# Patient Record
Sex: Male | Born: 2000 | Race: Black or African American | Hispanic: Yes | Marital: Single
Health system: Southern US, Community
[De-identification: ages and names within clinical notes are randomized; demographics above are authoritative.]

---
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
## 2021-04-27 ENCOUNTER — Inpatient Hospital Stay (HOSPITAL_COMMUNITY)
Admission: EM | Admit: 2021-04-27 | Discharge: 2021-04-29 | DRG: 492 | Disposition: A | Discharge status: Home / Self Care | Payer: Medicaid Other | Attending: Student | Admitting: Student

## 2021-04-27 DIAGNOSIS — S52021B Displaced fracture of olecranon process without intraarticular extension of right ulna, initial encounter for open fracture type I or II: Secondary | ICD-10-CM | POA: Diagnosis present

## 2021-04-27 DIAGNOSIS — S52261A Displaced segmental fracture of shaft of ulna, right arm, initial encounter for closed fracture: Secondary | ICD-10-CM | POA: Diagnosis present

## 2021-04-27 DIAGNOSIS — S41141A Puncture wound with foreign body of right upper arm, initial encounter: Secondary | ICD-10-CM | POA: Diagnosis present

## 2021-04-27 DIAGNOSIS — S42401B Unspecified fracture of lower end of right humerus, initial encounter for open fracture: Secondary | ICD-10-CM

## 2021-04-27 DIAGNOSIS — T148XXA Other injury of unspecified body region, initial encounter: Secondary | ICD-10-CM

## 2021-04-27 DIAGNOSIS — W3400XA Accidental discharge from unspecified firearms or gun, initial encounter: Secondary | ICD-10-CM

## 2021-04-27 DIAGNOSIS — Z20822 Contact with and (suspected) exposure to covid-19: Secondary | ICD-10-CM | POA: Diagnosis present

## 2021-04-27 DIAGNOSIS — Z23 Encounter for immunization: Secondary | ICD-10-CM

## 2021-04-27 DIAGNOSIS — Z419 Encounter for procedure for purposes other than remedying health state, unspecified: Secondary | ICD-10-CM

## 2021-04-27 DIAGNOSIS — T1490XA Injury, unspecified, initial encounter: Secondary | ICD-10-CM

## 2021-04-27 DIAGNOSIS — S42441B Displaced fracture (avulsion) of medial epicondyle of right humerus, initial encounter for open fracture: Secondary | ICD-10-CM

## 2021-04-28 ENCOUNTER — Emergency Department (HOSPITAL_COMMUNITY): Payer: Medicaid Other

## 2021-04-28 ENCOUNTER — Other Ambulatory Visit: Payer: Self-pay

## 2021-04-28 ENCOUNTER — Inpatient Hospital Stay (HOSPITAL_COMMUNITY): Payer: Medicaid Other

## 2021-04-28 ENCOUNTER — Inpatient Hospital Stay (HOSPITAL_COMMUNITY): Payer: Medicaid Other | Admitting: Anesthesiology

## 2021-04-28 ENCOUNTER — Encounter (HOSPITAL_COMMUNITY)
Admission: EM | Disposition: A | Discharge status: Home / Self Care | Payer: Self-pay | Source: Home / Self Care | Attending: Student

## 2021-04-28 DIAGNOSIS — Y249XXA Unspecified firearm discharge, undetermined intent, initial encounter: Secondary | ICD-10-CM

## 2021-04-28 DIAGNOSIS — Z23 Encounter for immunization: Secondary | ICD-10-CM | POA: Diagnosis not present

## 2021-04-28 DIAGNOSIS — S52261A Displaced segmental fracture of shaft of ulna, right arm, initial encounter for closed fracture: Secondary | ICD-10-CM | POA: Diagnosis present

## 2021-04-28 DIAGNOSIS — Z20822 Contact with and (suspected) exposure to covid-19: Secondary | ICD-10-CM | POA: Diagnosis present

## 2021-04-28 DIAGNOSIS — W3400XA Accidental discharge from unspecified firearms or gun, initial encounter: Secondary | ICD-10-CM

## 2021-04-28 DIAGNOSIS — S41141A Puncture wound with foreign body of right upper arm, initial encounter: Secondary | ICD-10-CM | POA: Diagnosis present

## 2021-04-28 DIAGNOSIS — S42401B Unspecified fracture of lower end of right humerus, initial encounter for open fracture: Secondary | ICD-10-CM

## 2021-04-28 DIAGNOSIS — S42441B Displaced fracture (avulsion) of medial epicondyle of right humerus, initial encounter for open fracture: Secondary | ICD-10-CM

## 2021-04-28 DIAGNOSIS — S52021B Displaced fracture of olecranon process without intraarticular extension of right ulna, initial encounter for open fracture type I or II: Secondary | ICD-10-CM | POA: Diagnosis present

## 2021-04-28 HISTORY — PX: ORIF ELBOW FRACTURE: SHX5031

## 2021-04-28 LAB — I-STAT CHEM 8, ED
BUN: 15 mg/dL (ref 6–20)
Calcium, Ion: 1.01 mmol/L — ABNORMAL LOW (ref 1.15–1.40)
Chloride: 106 mmol/L (ref 98–111)
Creatinine, Ser: 0.9 mg/dL (ref 0.61–1.24)
Glucose, Bld: 144 mg/dL — ABNORMAL HIGH (ref 70–99)
HCT: 45 % (ref 39.0–52.0)
Hemoglobin: 15.3 g/dL (ref 13.0–17.0)
Potassium: 2.8 mmol/L — ABNORMAL LOW (ref 3.5–5.1)
Sodium: 141 mmol/L (ref 135–145)
TCO2: 22 mmol/L (ref 22–32)

## 2021-04-28 LAB — VITAMIN D 25 HYDROXY (VIT D DEFICIENCY, FRACTURES): Vit D, 25-Hydroxy: 17.45 ng/mL — ABNORMAL LOW (ref 30–100)

## 2021-04-28 LAB — CBC
HCT: 34.1 % — ABNORMAL LOW (ref 39.0–52.0)
HCT: 44.4 % (ref 39.0–52.0)
Hemoglobin: 11.3 g/dL — ABNORMAL LOW (ref 13.0–17.0)
Hemoglobin: 15 g/dL (ref 13.0–17.0)
MCH: 31.1 pg (ref 26.0–34.0)
MCH: 31.3 pg (ref 26.0–34.0)
MCHC: 33.1 g/dL (ref 30.0–36.0)
MCHC: 33.8 g/dL (ref 30.0–36.0)
MCV: 92.1 fL (ref 80.0–100.0)
MCV: 94.5 fL (ref 80.0–100.0)
Platelets: 195 10*3/uL (ref 150–400)
Platelets: 262 10*3/uL (ref 150–400)
RBC: 3.61 MIL/uL — ABNORMAL LOW (ref 4.22–5.81)
RBC: 4.82 MIL/uL (ref 4.22–5.81)
RDW: 11.5 % (ref 11.5–15.5)
RDW: 11.9 % (ref 11.5–15.5)
WBC: 8.6 10*3/uL (ref 4.0–10.5)
WBC: 8.8 10*3/uL (ref 4.0–10.5)
nRBC: 0 % (ref 0.0–0.2)
nRBC: 0 % (ref 0.0–0.2)

## 2021-04-28 LAB — PROTIME-INR
INR: 1.3 — ABNORMAL HIGH (ref 0.8–1.2)
Prothrombin Time: 16.5 seconds — ABNORMAL HIGH (ref 11.4–15.2)

## 2021-04-28 LAB — RESP PANEL BY RT-PCR (FLU A&B, COVID) ARPGX2
Influenza A by PCR: NEGATIVE
Influenza B by PCR: NEGATIVE
SARS Coronavirus 2 by RT PCR: NEGATIVE

## 2021-04-28 LAB — COMPREHENSIVE METABOLIC PANEL
ALT: 15 U/L (ref 0–44)
AST: 22 U/L (ref 15–41)
Albumin: 4.5 g/dL (ref 3.5–5.0)
Alkaline Phosphatase: 64 U/L (ref 38–126)
Anion gap: 15 (ref 5–15)
BUN: 15 mg/dL (ref 6–20)
CO2: 19 mmol/L — ABNORMAL LOW (ref 22–32)
Calcium: 9.6 mg/dL (ref 8.9–10.3)
Chloride: 103 mmol/L (ref 98–111)
Creatinine, Ser: 1 mg/dL (ref 0.61–1.24)
GFR, Estimated: >60 mL/min (ref >60)
Glucose, Bld: 146 mg/dL — ABNORMAL HIGH (ref 70–99)
Potassium: 2.9 mmol/L — ABNORMAL LOW (ref 3.5–5.1)
Sodium: 137 mmol/L (ref 135–145)
Total Bilirubin: 1.1 mg/dL (ref 0.3–1.2)
Total Protein: 7.2 g/dL (ref 6.5–8.1)

## 2021-04-28 LAB — CREATININE, SERUM
Creatinine, Ser: 0.83 mg/dL (ref 0.61–1.24)
GFR, Estimated: >60 mL/min (ref >60)

## 2021-04-28 LAB — URINALYSIS, ROUTINE W REFLEX MICROSCOPIC
Bilirubin Urine: NEGATIVE
Glucose, UA: NEGATIVE mg/dL
Hgb urine dipstick: NEGATIVE
Ketones, ur: NEGATIVE mg/dL
Leukocytes,Ua: NEGATIVE
Nitrite: NEGATIVE
Protein, ur: NEGATIVE mg/dL
Specific Gravity, Urine: >1.046 — ABNORMAL HIGH (ref 1.005–1.030)
pH: 6 (ref 5.0–8.0)

## 2021-04-28 LAB — SAMPLE TO BLOOD BANK

## 2021-04-28 LAB — HIV ANTIBODY (ROUTINE TESTING W REFLEX): HIV Screen 4th Generation wRfx: NONREACTIVE

## 2021-04-28 LAB — ETHANOL: Alcohol, Ethyl (B): <10 mg/dL (ref <10)

## 2021-04-28 LAB — LACTIC ACID, PLASMA: Lactic Acid, Venous: 1.9 mmol/L (ref 0.5–1.9)

## 2021-04-28 SURGERY — OPEN REDUCTION INTERNAL FIXATION (ORIF) ELBOW/OLECRANON FRACTURE
Anesthesia: General | Site: Elbow | Laterality: Right

## 2021-04-28 MED ORDER — SODIUM CHLORIDE 0.9 % IV SOLN: INTRAVENOUS | Status: DC

## 2021-04-28 MED ORDER — METHOCARBAMOL 1000 MG/10ML IJ SOLN
500.0000 mg | Freq: Four times a day (QID) | INTRAVENOUS | Status: DC | PRN
  Filled 2021-04-28: qty 5

## 2021-04-28 MED ORDER — CEFAZOLIN SODIUM-DEXTROSE 2-4 GM/100ML-% IV SOLN
2.0000 g | Freq: Three times a day (TID) | INTRAVENOUS | Status: DC
  Administered 2021-04-28: 2 g via INTRAVENOUS
  Filled 2021-04-28: qty 100

## 2021-04-28 MED ORDER — PROPOFOL 10 MG/ML IV BOLUS
INTRAVENOUS | Status: AC
  Filled 2021-04-28: qty 40

## 2021-04-28 MED ORDER — OXYCODONE HCL 5 MG PO TABS: 5.0000 mg | ORAL_TABLET | Freq: Once | ORAL | Status: DC | PRN

## 2021-04-28 MED ORDER — FENTANYL CITRATE (PF) 100 MCG/2ML IJ SOLN: 25.0000 ug | INTRAMUSCULAR | Status: DC | PRN

## 2021-04-28 MED ORDER — ONDANSETRON HCL 4 MG/2ML IJ SOLN: 4.0000 mg | Freq: Four times a day (QID) | INTRAMUSCULAR | Status: DC | PRN

## 2021-04-28 MED ORDER — LACTATED RINGERS IV SOLN: INTRAVENOUS | Status: DC | PRN

## 2021-04-28 MED ORDER — HYDROMORPHONE HCL 1 MG/ML IJ SOLN
1.0000 mg | INTRAMUSCULAR | Status: DC | PRN
  Administered 2021-04-28: 1 mg via INTRAVENOUS
  Filled 2021-04-28: qty 1

## 2021-04-28 MED ORDER — ENOXAPARIN SODIUM 40 MG/0.4ML IJ SOSY
40.0000 mg | PREFILLED_SYRINGE | INTRAMUSCULAR | Status: DC
  Administered 2021-04-29: 40 mg via SUBCUTANEOUS
  Filled 2021-04-28: qty 0.4

## 2021-04-28 MED ORDER — ACETAMINOPHEN 325 MG PO TABS
650.0000 mg | ORAL_TABLET | Freq: Four times a day (QID) | ORAL | Status: DC
  Administered 2021-04-28 – 2021-04-29 (×5): 650 mg via ORAL
  Filled 2021-04-28 (×4): qty 2

## 2021-04-28 MED ORDER — LIDOCAINE 2% (20 MG/ML) 5 ML SYRINGE
INTRAMUSCULAR | Status: DC | PRN
  Administered 2021-04-28: 80 mg via INTRAVENOUS

## 2021-04-28 MED ORDER — ONDANSETRON HCL 4 MG PO TABS: 4.0000 mg | ORAL_TABLET | Freq: Four times a day (QID) | ORAL | Status: DC | PRN

## 2021-04-28 MED ORDER — IOHEXOL 350 MG/ML SOLN
100.0000 mL | Freq: Once | INTRAVENOUS | Status: AC | PRN
  Administered 2021-04-28: 100 mL via INTRAVENOUS

## 2021-04-28 MED ORDER — 0.9 % SODIUM CHLORIDE (POUR BTL) OPTIME
TOPICAL | Status: DC | PRN
  Administered 2021-04-28: 1000 mL

## 2021-04-28 MED ORDER — CEFAZOLIN SODIUM-DEXTROSE 2-4 GM/100ML-% IV SOLN
2.0000 g | Freq: Once | INTRAVENOUS | Status: AC
  Administered 2021-04-28: 2 g via INTRAVENOUS
  Filled 2021-04-28: qty 100

## 2021-04-28 MED ORDER — GABAPENTIN 100 MG PO CAPS: 100.0000 mg | ORAL_CAPSULE | Freq: Three times a day (TID) | ORAL | 0 refills | Status: AC

## 2021-04-28 MED ORDER — DIPHENHYDRAMINE HCL 12.5 MG/5ML PO ELIX: 12.5000 mg | ORAL_SOLUTION | ORAL | Status: DC | PRN

## 2021-04-28 MED ORDER — ASPIRIN EC 81 MG PO TBEC: 81.0000 mg | DELAYED_RELEASE_TABLET | Freq: Every day | ORAL | 0 refills | Status: AC

## 2021-04-28 MED ORDER — PROPOFOL 10 MG/ML IV BOLUS
INTRAVENOUS | Status: DC | PRN
  Administered 2021-04-28: 200 mg via INTRAVENOUS

## 2021-04-28 MED ORDER — CHLORHEXIDINE GLUCONATE 0.12 % MT SOLN
15.0000 mL | Freq: Once | OROMUCOSAL | Status: AC
  Administered 2021-04-28: 15 mL via OROMUCOSAL

## 2021-04-28 MED ORDER — LIDOCAINE 2% (20 MG/ML) 5 ML SYRINGE
INTRAMUSCULAR | Status: AC
  Filled 2021-04-28: qty 5

## 2021-04-28 MED ORDER — ACETAMINOPHEN 325 MG PO TABS: 325.0000 mg | ORAL_TABLET | Freq: Four times a day (QID) | ORAL | Status: DC | PRN

## 2021-04-28 MED ORDER — ONDANSETRON HCL 4 MG/2ML IJ SOLN
4.0000 mg | Freq: Four times a day (QID) | INTRAMUSCULAR | Status: DC | PRN
  Administered 2021-04-28: 4 mg via INTRAVENOUS
  Filled 2021-04-28: qty 2

## 2021-04-28 MED ORDER — METHOCARBAMOL 500 MG PO TABS: 500.0000 mg | ORAL_TABLET | Freq: Four times a day (QID) | ORAL | 0 refills | Status: AC | PRN

## 2021-04-28 MED ORDER — SUCCINYLCHOLINE CHLORIDE 20 MG/ML IJ SOLN
INTRAMUSCULAR | Status: DC | PRN
  Administered 2021-04-28: 60 mg via INTRAVENOUS

## 2021-04-28 MED ORDER — CEFAZOLIN SODIUM-DEXTROSE 2-4 GM/100ML-% IV SOLN
2.0000 g | Freq: Three times a day (TID) | INTRAVENOUS | Status: AC
  Administered 2021-04-28 – 2021-04-29 (×3): 2 g via INTRAVENOUS
  Filled 2021-04-28 (×4): qty 100

## 2021-04-28 MED ORDER — OXYCODONE HCL 5 MG/5ML PO SOLN: 5.0000 mg | Freq: Once | ORAL | Status: DC | PRN

## 2021-04-28 MED ORDER — METHOCARBAMOL 500 MG PO TABS: 500.0000 mg | ORAL_TABLET | Freq: Four times a day (QID) | ORAL | Status: DC | PRN

## 2021-04-28 MED ORDER — ORAL CARE MOUTH RINSE: 15.0000 mL | Freq: Once | OROMUCOSAL | Status: AC

## 2021-04-28 MED ORDER — MIDAZOLAM HCL 5 MG/5ML IJ SOLN
INTRAMUSCULAR | Status: DC | PRN
  Administered 2021-04-28: 2 mg via INTRAVENOUS

## 2021-04-28 MED ORDER — DEXAMETHASONE SODIUM PHOSPHATE 10 MG/ML IJ SOLN
INTRAMUSCULAR | Status: AC
  Filled 2021-04-28: qty 1

## 2021-04-28 MED ORDER — POLYETHYLENE GLYCOL 3350 17 G PO PACK: 17.0000 g | PACK | Freq: Every day | ORAL | Status: DC | PRN

## 2021-04-28 MED ORDER — TOBRAMYCIN SULFATE 1.2 G IJ SOLR
INTRAMUSCULAR | Status: DC | PRN
  Administered 2021-04-28: 1.2 g via TOPICAL

## 2021-04-28 MED ORDER — METOCLOPRAMIDE HCL 5 MG/ML IJ SOLN: 5.0000 mg | Freq: Three times a day (TID) | INTRAMUSCULAR | Status: DC | PRN

## 2021-04-28 MED ORDER — METOCLOPRAMIDE HCL 5 MG PO TABS: 5.0000 mg | ORAL_TABLET | Freq: Three times a day (TID) | ORAL | Status: DC | PRN

## 2021-04-28 MED ORDER — FENTANYL CITRATE (PF) 100 MCG/2ML IJ SOLN
INTRAMUSCULAR | Status: AC | PRN
  Administered 2021-04-28: 50 ug via INTRAVENOUS

## 2021-04-28 MED ORDER — DOCUSATE SODIUM 100 MG PO CAPS: 100.0000 mg | ORAL_CAPSULE | Freq: Two times a day (BID) | ORAL | 0 refills | Status: AC

## 2021-04-28 MED ORDER — FENTANYL CITRATE (PF) 250 MCG/5ML IJ SOLN
INTRAMUSCULAR | Status: AC
  Filled 2021-04-28: qty 5

## 2021-04-28 MED ORDER — LACTATED RINGERS IV SOLN: INTRAVENOUS | Status: DC

## 2021-04-28 MED ORDER — ONDANSETRON HCL 4 MG/2ML IJ SOLN
INTRAMUSCULAR | Status: DC | PRN
  Administered 2021-04-28: 4 mg via INTRAVENOUS

## 2021-04-28 MED ORDER — ONDANSETRON HCL 4 MG/2ML IJ SOLN
INTRAMUSCULAR | Status: AC
  Filled 2021-04-28: qty 2

## 2021-04-28 MED ORDER — AMISULPRIDE (ANTIEMETIC) 5 MG/2ML IV SOLN: 10.0000 mg | Freq: Once | INTRAVENOUS | Status: DC | PRN

## 2021-04-28 MED ORDER — ROCURONIUM BROMIDE 10 MG/ML (PF) SYRINGE
PREFILLED_SYRINGE | INTRAVENOUS | Status: DC | PRN
  Administered 2021-04-28: 25 mg via INTRAVENOUS
  Administered 2021-04-28: 5 mg via INTRAVENOUS

## 2021-04-28 MED ORDER — FENTANYL CITRATE (PF) 100 MCG/2ML IJ SOLN
INTRAMUSCULAR | Status: AC
  Filled 2021-04-28: qty 2

## 2021-04-28 MED ORDER — DOCUSATE SODIUM 100 MG PO CAPS
100.0000 mg | ORAL_CAPSULE | Freq: Two times a day (BID) | ORAL | Status: DC
  Administered 2021-04-28 – 2021-04-29 (×3): 100 mg via ORAL
  Filled 2021-04-28 (×3): qty 1

## 2021-04-28 MED ORDER — ONDANSETRON HCL 4 MG/2ML IJ SOLN: 4.0000 mg | Freq: Once | INTRAMUSCULAR | Status: DC | PRN

## 2021-04-28 MED ORDER — PHENYLEPHRINE 40 MCG/ML (10ML) SYRINGE FOR IV PUSH (FOR BLOOD PRESSURE SUPPORT)
PREFILLED_SYRINGE | INTRAVENOUS | Status: AC
  Filled 2021-04-28: qty 10

## 2021-04-28 MED ORDER — MIDAZOLAM HCL 2 MG/2ML IJ SOLN
INTRAMUSCULAR | Status: AC
  Filled 2021-04-28: qty 2

## 2021-04-28 MED ORDER — OXYCODONE HCL 10 MG PO TABS: 5.0000 mg | ORAL_TABLET | ORAL | 0 refills | Status: AC | PRN

## 2021-04-28 MED ORDER — SUCCINYLCHOLINE CHLORIDE 200 MG/10ML IV SOSY
PREFILLED_SYRINGE | INTRAVENOUS | Status: AC
  Filled 2021-04-28: qty 10

## 2021-04-28 MED ORDER — PHENYLEPHRINE HCL (PRESSORS) 10 MG/ML IV SOLN
INTRAVENOUS | Status: DC | PRN
  Administered 2021-04-28 (×7): 100 ug via INTRAVENOUS

## 2021-04-28 MED ORDER — FENTANYL CITRATE (PF) 250 MCG/5ML IJ SOLN
INTRAMUSCULAR | Status: DC | PRN
  Administered 2021-04-28: 50 ug via INTRAVENOUS

## 2021-04-28 MED ORDER — VANCOMYCIN HCL 1000 MG IV SOLR
INTRAVENOUS | Status: DC | PRN
  Administered 2021-04-28: 1000 mg via TOPICAL

## 2021-04-28 MED ORDER — ROCURONIUM BROMIDE 10 MG/ML (PF) SYRINGE
PREFILLED_SYRINGE | INTRAVENOUS | Status: AC
  Filled 2021-04-28: qty 10

## 2021-04-28 MED ORDER — METHOCARBAMOL 1000 MG/10ML IJ SOLN
500.0000 mg | Freq: Four times a day (QID) | INTRAVENOUS | Status: DC | PRN
  Filled 2021-04-28 (×2): qty 5

## 2021-04-28 MED ORDER — KETOROLAC TROMETHAMINE 15 MG/ML IJ SOLN
15.0000 mg | Freq: Four times a day (QID) | INTRAMUSCULAR | Status: AC
  Administered 2021-04-28 (×4): 15 mg via INTRAVENOUS
  Filled 2021-04-28 (×4): qty 1

## 2021-04-28 MED ORDER — OXYCODONE HCL 5 MG PO TABS
5.0000 mg | ORAL_TABLET | ORAL | Status: DC | PRN
  Administered 2021-04-28 – 2021-04-29 (×3): 10 mg via ORAL
  Filled 2021-04-28 (×3): qty 2

## 2021-04-28 MED ORDER — VANCOMYCIN HCL 1000 MG IV SOLR
INTRAVENOUS | Status: AC
  Filled 2021-04-28: qty 1000

## 2021-04-28 MED ORDER — DEXAMETHASONE SODIUM PHOSPHATE 10 MG/ML IJ SOLN
INTRAMUSCULAR | Status: DC | PRN
  Administered 2021-04-28: 4 mg via INTRAVENOUS

## 2021-04-28 MED ORDER — METHOCARBAMOL 500 MG PO TABS
500.0000 mg | ORAL_TABLET | Freq: Four times a day (QID) | ORAL | Status: DC | PRN
  Administered 2021-04-28: 500 mg via ORAL
  Filled 2021-04-28: qty 1

## 2021-04-28 MED ORDER — HYDRALAZINE HCL 10 MG PO TABS: 10.0000 mg | ORAL_TABLET | Freq: Four times a day (QID) | ORAL | Status: DC | PRN

## 2021-04-28 MED ORDER — OXYCODONE HCL 5 MG PO TABS
10.0000 mg | ORAL_TABLET | ORAL | Status: DC | PRN
  Administered 2021-04-29: 15 mg via ORAL
  Filled 2021-04-28: qty 3

## 2021-04-28 MED ORDER — TOBRAMYCIN SULFATE 1.2 G IJ SOLR
INTRAMUSCULAR | Status: AC
  Filled 2021-04-28: qty 1.2

## 2021-04-28 MED ORDER — TETANUS-DIPHTH-ACELL PERTUSSIS 5-2.5-18.5 LF-MCG/0.5 IM SUSY
0.5000 mL | PREFILLED_SYRINGE | Freq: Once | INTRAMUSCULAR | Status: AC
  Administered 2021-04-28: 0.5 mL via INTRAMUSCULAR
  Filled 2021-04-28: qty 0.5

## 2021-04-28 SURGICAL SUPPLY — 99 items
BAG COUNTER SPONGE SURGICOUNT (BAG) ×2 IMPLANT
BENZOIN TINCTURE PRP APPL 2/3 (GAUZE/BANDAGES/DRESSINGS) IMPLANT
BIT DRILL 2.7 QC CANN 155 (BIT) ×2 IMPLANT
BIT DRILL QC 2.0 SHORT EVOS SM (DRILL) ×1 IMPLANT
BIT DRILL QC 2.5MM SHRT EVO SM (DRILL) ×1 IMPLANT
BLADE AVERAGE 25X9 (BLADE) IMPLANT
BLADE CLIPPER SURG (BLADE) ×2 IMPLANT
BLADE SURG 10 STRL SS (BLADE) IMPLANT
BNDG COHESIVE 4X5 TAN STRL (GAUZE/BANDAGES/DRESSINGS) ×2 IMPLANT
BNDG ELASTIC 3X5.8 VLCR STR LF (GAUZE/BANDAGES/DRESSINGS) ×2 IMPLANT
BNDG ELASTIC 4X5.8 VLCR STR LF (GAUZE/BANDAGES/DRESSINGS) ×2 IMPLANT
BNDG ELASTIC 6X5.8 VLCR STR LF (GAUZE/BANDAGES/DRESSINGS) ×2 IMPLANT
BNDG ESMARK 4X9 LF (GAUZE/BANDAGES/DRESSINGS) IMPLANT
BNDG GAUZE ELAST 4 BULKY (GAUZE/BANDAGES/DRESSINGS) ×2 IMPLANT
BRUSH SCRUB EZ PLAIN DRY (MISCELLANEOUS) ×4 IMPLANT
CHLORAPREP W/TINT 26 (MISCELLANEOUS) ×2 IMPLANT
CLEANER TIP ELECTROSURG 2X2 (MISCELLANEOUS) ×2 IMPLANT
COVER SURGICAL LIGHT HANDLE (MISCELLANEOUS) ×4 IMPLANT
CUFF TOURN SGL QUICK 18X4 (TOURNIQUET CUFF) IMPLANT
CUFF TOURN SGL QUICK 24 (TOURNIQUET CUFF)
CUFF TRNQT CYL 24X4X16.5-23 (TOURNIQUET CUFF) IMPLANT
DECANTER SPIKE VIAL GLASS SM (MISCELLANEOUS) IMPLANT
DRAPE C-ARM 42X72 X-RAY (DRAPES) ×2 IMPLANT
DRAPE C-ARMOR (DRAPES) ×2 IMPLANT
DRAPE INCISE IOBAN 66X45 STRL (DRAPES) IMPLANT
DRAPE ORTHO SPLIT 77X108 STRL (DRAPES) ×2
DRAPE SURG ORHT 6 SPLT 77X108 (DRAPES) ×2 IMPLANT
DRAPE U-SHAPE 47X51 STRL (DRAPES) ×2 IMPLANT
DRILL QC 2.0 SHORT EVOS SM (DRILL) ×2
DRILL QC 2.5MM SHORT EVOS SM (DRILL) ×2
DRSG ADAPTIC 3X8 NADH LF (GAUZE/BANDAGES/DRESSINGS) IMPLANT
DRSG MEPITEL 4X7.2 (GAUZE/BANDAGES/DRESSINGS) ×2 IMPLANT
ELECT REM PT RETURN 9FT ADLT (ELECTROSURGICAL) ×2
ELECTRODE REM PT RTRN 9FT ADLT (ELECTROSURGICAL) ×1 IMPLANT
GAUZE SPONGE 4X4 12PLY STRL (GAUZE/BANDAGES/DRESSINGS) ×2 IMPLANT
GAUZE SPONGE 4X4 12PLY STRL LF (GAUZE/BANDAGES/DRESSINGS) ×2 IMPLANT
GAUZE XEROFORM 5X9 LF (GAUZE/BANDAGES/DRESSINGS) ×2 IMPLANT
GLOVE SURG ENC MOIS LTX SZ6.5 (GLOVE) ×6 IMPLANT
GLOVE SURG ENC MOIS LTX SZ7.5 (GLOVE) ×8 IMPLANT
GLOVE SURG UNDER POLY LF SZ6.5 (GLOVE) ×2 IMPLANT
GLOVE SURG UNDER POLY LF SZ7.5 (GLOVE) ×2 IMPLANT
GOWN STRL REUS W/ TWL LRG LVL3 (GOWN DISPOSABLE) ×2 IMPLANT
GOWN STRL REUS W/TWL LRG LVL3 (GOWN DISPOSABLE) ×2
K-WIRE 1.3X40 (WIRE) ×4
K-WIRE 1.6 (WIRE) ×1
K-WIRE FX150X1.6XTROC PNT (WIRE) ×1
KIT BASIN OR (CUSTOM PROCEDURE TRAY) ×2 IMPLANT
KIT TURNOVER KIT B (KITS) ×2 IMPLANT
KWIRE 1.3X40 (WIRE) ×2 IMPLANT
KWIRE FX150X1.6XTROC PNT (WIRE) ×1 IMPLANT
MANIFOLD NEPTUNE II (INSTRUMENTS) ×2 IMPLANT
NS IRRIG 1000ML POUR BTL (IV SOLUTION) ×2 IMPLANT
PACK ORTHO EXTREMITY (CUSTOM PROCEDURE TRAY) ×2 IMPLANT
PAD ABD 8X10 STRL (GAUZE/BANDAGES/DRESSINGS) ×2 IMPLANT
PAD ARMBOARD 7.5X6 YLW CONV (MISCELLANEOUS) ×4 IMPLANT
PAD CAST 3X4 CTTN HI CHSV (CAST SUPPLIES) ×1 IMPLANT
PAD CAST 4YDX4 CTTN HI CHSV (CAST SUPPLIES) ×1 IMPLANT
PADDING CAST COTTON 3X4 STRL (CAST SUPPLIES) ×1
PADDING CAST COTTON 4X4 STRL (CAST SUPPLIES) ×1
PLATE OLEC EVOS 2.7X179 13H RT (Plate) ×2 IMPLANT
SCREW BONE LOCKING 2.7 X 48 (Screw) ×2 IMPLANT
SCREW CANN FT ST 4X60 (Screw) ×2 IMPLANT
SCREW CORT 2.7X14 T8 EVOS (Screw) ×2 IMPLANT
SCREW CORT 2.7X20 T8 ST EVOS (Screw) ×2 IMPLANT
SCREW CORT 2.7X22 T8 ST EVOS (Screw) ×2 IMPLANT
SCREW CORT 3.5X13MM (Screw) ×4 IMPLANT
SCREW CORT 3.5X14 ST EVOS (Screw) ×2 IMPLANT
SCREW CORT 3.5X16 ST EVOS (Screw) ×2 IMPLANT
SCREW CORT 3.5X20 ST EVOS (Screw) ×2 IMPLANT
SCREW CORT 3.5X22 ST EVOS (Screw) ×2 IMPLANT
SCREW CORT EVOS ST 3.5X12 (Screw) ×2 IMPLANT
SCREW CORT VA EVOS 2.7X28 (Screw) ×2 IMPLANT
SCREW EVOS 2.7 X 50 LCK T8 S-T (Screw) ×2 IMPLANT
SCREW FULL THREAD 4.0X30 (Screw) ×2 IMPLANT
SPLINT PLASTER CAST XFAST 5X30 (CAST SUPPLIES) ×1 IMPLANT
SPLINT PLASTER XFAST SET 5X30 (CAST SUPPLIES) ×1
SPONGE T-LAP 18X18 ~~LOC~~+RFID (SPONGE) ×4 IMPLANT
STAPLER VISISTAT 35W (STAPLE) ×2 IMPLANT
STRIP CLOSURE SKIN 1/2X4 (GAUZE/BANDAGES/DRESSINGS) IMPLANT
SUCTION FRAZIER HANDLE 10FR (MISCELLANEOUS) ×1
SUCTION TUBE FRAZIER 10FR DISP (MISCELLANEOUS) ×1 IMPLANT
SUT ETHILON 3 0 PS 1 (SUTURE) ×4 IMPLANT
SUT MNCRL AB 3-0 PS2 18 (SUTURE) ×2 IMPLANT
SUT MON AB 2-0 CT1 36 (SUTURE) ×4 IMPLANT
SUT PDS AB 2-0 CT1 27 (SUTURE) IMPLANT
SUT PROLENE 0 CT (SUTURE) IMPLANT
SUT PROLENE 3 0 PS 2 (SUTURE) ×4 IMPLANT
SUT VIC AB 0 CT1 27 (SUTURE) ×2
SUT VIC AB 0 CT1 27XBRD ANBCTR (SUTURE) ×2 IMPLANT
SUT VIC AB 2-0 CT1 27 (SUTURE) ×2
SUT VIC AB 2-0 CT1 TAPERPNT 27 (SUTURE) ×2 IMPLANT
SUT VIC AB 2-0 CT3 27 (SUTURE) IMPLANT
SYR CONTROL 10ML LL (SYRINGE) ×2 IMPLANT
TOWEL GREEN STERILE (TOWEL DISPOSABLE) ×4 IMPLANT
TOWEL GREEN STERILE FF (TOWEL DISPOSABLE) IMPLANT
TUBE CONNECTING 12X1/4 (SUCTIONS) ×2 IMPLANT
UNDERPAD 30X36 HEAVY ABSORB (UNDERPADS AND DIAPERS) ×2 IMPLANT
WATER STERILE IRR 1000ML POUR (IV SOLUTION) ×2 IMPLANT
YANKAUER SUCT BULB TIP NO VENT (SUCTIONS) ×2 IMPLANT

## 2021-04-28 NOTE — Plan of Care (Signed)

## 2021-04-28 NOTE — ED Notes (Signed)
Ortho at bedside applying splint

## 2021-04-28 NOTE — ED Notes (Signed)
Pt in room on phone with mother. A&O x4. using urinal at bedside

## 2021-04-28 NOTE — ED Notes (Signed)
Pt in CT scan.

## 2021-04-28 NOTE — ED Notes (Addendum)
Torniquet removed at this time. Pulse present. Pressure dressing applied.
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
## 2021-04-28 NOTE — ED Notes (Signed)
Pt resting in room. NAD Mother at bedside. Updated on plan of care.

## 2021-04-28 NOTE — Anesthesia Postprocedure Evaluation (Signed)
Anesthesia Post Note  Patient: Joshua Taylor  Procedure(s) Performed: OPEN REDUCTION INTERNAL FIXATION (ORIF) ELBOW/OLECRANON FRACTURE (Right: Elbow)     Patient location during evaluation: PACU Anesthesia Type: General Level of consciousness: awake and alert Pain management: pain level controlled Vital Signs Assessment: post-procedure vital signs reviewed and stable Respiratory status: spontaneous breathing, nonlabored ventilation and respiratory function stable Cardiovascular status: blood pressure returned to baseline and stable Postop Assessment: no apparent nausea or vomiting Anesthetic complications: no   No notable events documented.  Last Vitals:  Vitals:   04/28/21 1127 04/28/21 1145  BP: 127/80 127/80  Pulse: 81 75  Resp: 12 11  Temp:  (!) 36.4 C  SpO2: 100% 100%    Last Pain:  Vitals:   04/28/21 1145  TempSrc:   PainSc: Asleep                 Lucretia Kern
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
## 2021-04-28 NOTE — Anesthesia Procedure Notes (Signed)
Procedure Name: Intubation Date/Time: 04/28/2021 8:10 AM Performed by: Geraldine Contras, CRNA Pre-anesthesia Checklist: Patient identified, Patient being monitored, Timeout performed, Emergency Drugs available and Suction available Patient Re-evaluated:Patient Re-evaluated prior to induction Oxygen Delivery Method: Circle system utilized Preoxygenation: Pre-oxygenation with 100% oxygen Induction Type: IV induction Ventilation: Mask ventilation without difficulty Laryngoscope Size: Mac and 3 Grade View: Grade I Tube type: Oral Tube size: 7.0 mm Number of attempts: 1 Airway Equipment and Method: Stylet Placement Confirmation: ETT inserted through vocal cords under direct vision, positive ETCO2 and breath sounds checked- equal and bilateral Secured at: 21 cm Tube secured with: Tape Dental Injury: Teeth and Oropharynx as per pre-operative assessment
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
## 2021-04-28 NOTE — Progress Notes (Signed)
Orthopedic Tech Progress Note Patient Details:  Joshua Taylor 05/17/2001 [id number]  Ortho Devices Type of Ortho Device: Long arm splint Ortho Device/Splint Location: RUE Ortho Device/Splint Interventions: Ordered, Application, Adjustment   Post Interventions Patient Tolerated: Fair Instructions Provided: Other (comment)  [name] 04/28/2021, 1:37 AM
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
## 2021-04-28 NOTE — Transfer of Care (Signed)
Immediate Anesthesia Transfer of Care Note  Patient: Joshua Taylor  Procedure(s) Performed: OPEN REDUCTION INTERNAL FIXATION (ORIF) ELBOW/OLECRANON FRACTURE (Right: Elbow)  Patient Location: PACU  Anesthesia Type:General  Level of Consciousness: awake  Airway & Oxygen Therapy: Patient Spontanous Breathing  Post-op Assessment: Report given to RN  Post vital signs: stable  Last Vitals:  Vitals Value Taken Time  BP    Temp    Pulse    Resp    SpO2      Last Pain:  Vitals:   04/28/21 0733  TempSrc: Oral  PainSc:          Complications: No notable events documented.
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
## 2021-04-28 NOTE — ED Notes (Addendum)
Pt given apple juice.  Pt tolerated well.

## 2021-04-28 NOTE — ED Notes (Signed)
Patient transported to CT 

## 2021-04-28 NOTE — Discharge Instructions (Signed)
Truitt Merle, MD Ulyses Southward PA-C [facility] [address] [phone] Jani Files)   [phone] (fax)                                  POST-OPERATIVE INSTRUCTIONS   WEIGHT BEARING STATUS: Non-weightbearing right arm  RANGE OF MOTION/ACTIVITY: Wear sling for comfort. Do no remove splint  WOUND CARE Please keep splint clean dry and intact until follow-up. If your splint gets wet for any reason please contact the office immediately.  Do not stick anything down your splint such as pencils, momey, hangers to try and scratch yourself.  If you feel itchy take Benadryl as prescribed on the bottle for itching You may shower on Post-Op Day #2.  You must keep splint dry during this process and may find that a plastic bag taped around the extremity or alternatively a towel based bath may be a better option.   If you get your splint wet or if it is damaged please contact our clinic.  EXERCISES Due to your splint being in place you will not be able to bear weight through your extremity.   Please use your sling until follow-up. Please continue to work on range of motion of your fingers and stretch these multiple times a day to prevent stiffness. Please continue to ambulate and do not stay sitting or lying for too long. Perform foot and wrist pumps to assist in circulation.  DVT/PE prophylaxis: Aspirin 325 mg daily x 30 days  DIET: As you were eating previously.  Can use over the counter stool softeners and bowel preparations, such as Miralax, to help with bowel movements.  Narcotics can be constipating.  Be sure to drink plenty of fluids  REGIONAL ANESTHESIA (NERVE BLOCKS) The anesthesia team may have performed a nerve block for you if safe in the setting of your care.  This is a great tool used to minimize pain.  Typically the block may start wearing off overnight but the long acting medicine may last for 3-4 days.  The nerve block wearing off can be a challenging period  but please utilize your as needed pain medications to try and manage this period.    ICE AND ELEVATE INJURED/OPERATIVE EXTREMITY Using ice and elevating the injured extremity above your heart can help with swelling and pain control.  Icing in a pulsatile fashion, such as 20 minutes on and 20 minutes off, can be followed.   Do not place ice directly on skin. Make sure there is a barrier between to skin and the ice pack.   Using frozen items such as frozen peas works well as the conform nicely to the are that needs to be iced.  POST-OP MEDICATIONS- Multimodal approach to pain control In general your pain will be controlled with a combination of substances.  Prescriptions unless otherwise discussed are electronically sent to your pharmacy.  This is a carefully made plan we use to minimize narcotic use.                     -   Acetaminophen - Non-narcotic pain medicine taken on a scheduled basis        -   Oxycodone - This is a strong narcotic, to be used only on an "as needed" basis for pain.                  -  Robaxin -  Muscle relaxer taken on an "as needed" basis for muscle spasms                  -   Gabapentin - Helps with nerve pain, such as burning or tingling feeling in the operative arm             STOP SMOKING OR USING NICOTINE PRODUCTS!!!!  As discussed nicotine severely impairs your body's ability to heal surgical and traumatic wounds but also impairs bone healing.  Wounds and bone heal by forming microscopic blood vessels (angiogenesis) and nicotine is a vasoconstrictor (essentially, shrinks blood vessels).  Therefore, if vasoconstriction occurs to these microscopic blood vessels they essentially disappear and are unable to deliver necessary nutrients to the healing tissue.  This is one modifiable factor that you can do to dramatically increase your chances of healing your injury.    (This means no smoking, no nicotine gum, patches, etc)   FOLLOW-UP If you develop a Fever (>101.5),  Redness or Drainage from the surgical incision site, please call our office to arrange for an evaluation. Please call the office to schedule a follow-up appointment for your incision check if you do not already have one, 7-10 days post-operatively.  CALL THE OFFICE WITH ANY QUESTIONS OR CONCERNS: [phone]   VISIT OUR WEBSITE FOR ADDITIONAL INFORMATION: [email or web address]    OTHER HELPFUL INFORMATION  If you had a block, it will wear off between 8-24 hrs postop typically.  This is period when your pain may go from nearly zero to the pain you would have had postop without the block.  This is an abrupt transition but nothing dangerous is happening.  You may take an extra dose of narcotic when this happens.  You may be more comfortable sleeping in a semi-seated position the first few nights following surgery.  Keep a pillow propped under the elbow and forearm for comfort.  If you have a recliner type of chair it might be beneficial.  If not that is fine too, but it would be helpful to sleep propped up with pillows behind your operated shoulder as well under your elbow and forearm.  This will reduce pulling on the suture lines.  When dressing, put your operative arm in the sleeve first.  When getting undressed, take your operative arm out last.  Loose fitting, button-down shirts are recommended.  Often in the first days after surgery you may be more comfortable keeping your operative arm under your shirt and not through the sleeve.  You may return to work/school in the next couple of days when you feel up to it.  Desk work and typing in the sling is fine.  We suggest you use the pain medication the first night prior to going to bed, in order to ease any pain when the anesthesia wears off. You should avoid taking pain medications on an empty stomach as it will make you nauseous. You should wean off your narcotic medicines as soon as you are able.  Most patients will be off or using minimal  narcotics before their first postop appointment.   Do not drink alcoholic beverages or take illicit drugs when taking pain medications.  It is against the law to drive while taking narcotics.  In some states it is against the law to drive while your arm is in a sling.   Pain medication may make you constipated.  Below are a few solutions to try in this order: Decrease the amount of pain  medication if you aren't having pain. Drink lots of decaffeinated fluids. Drink prune juice and/or each dried prunes  If the first 3 don't work start with additional solutions -   Take Colace - an over-the-counter stool softener -   Take Senokot - an over-the-counter laxative -   Take Miralax - a stronger over-the-counter laxative
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
## 2021-04-29 LAB — BASIC METABOLIC PANEL
Anion gap: 5 (ref 5–15)
BUN: 9 mg/dL (ref 6–20)
CO2: 24 mmol/L (ref 22–32)
Calcium: 8.4 mg/dL — ABNORMAL LOW (ref 8.9–10.3)
Chloride: 108 mmol/L (ref 98–111)
Creatinine, Ser: 0.73 mg/dL (ref 0.61–1.24)
GFR, Estimated: >60 mL/min (ref >60)
Glucose, Bld: 123 mg/dL — ABNORMAL HIGH (ref 70–99)
Potassium: 3.9 mmol/L (ref 3.5–5.1)
Sodium: 137 mmol/L (ref 135–145)

## 2021-04-29 LAB — CBC
HCT: 28.1 % — ABNORMAL LOW (ref 39.0–52.0)
Hemoglobin: 9.7 g/dL — ABNORMAL LOW (ref 13.0–17.0)
MCH: 31.8 pg (ref 26.0–34.0)
MCHC: 34.5 g/dL (ref 30.0–36.0)
MCV: 92.1 fL (ref 80.0–100.0)
Platelets: 181 10*3/uL (ref 150–400)
RBC: 3.05 MIL/uL — ABNORMAL LOW (ref 4.22–5.81)
RDW: 11.9 % (ref 11.5–15.5)
WBC: 10.8 10*3/uL — ABNORMAL HIGH (ref 4.0–10.5)
nRBC: 0 % (ref 0.0–0.2)

## 2021-04-29 MED ORDER — ENOXAPARIN SODIUM 30 MG/0.3ML IJ SOSY: 30.0000 mg | PREFILLED_SYRINGE | INTRAMUSCULAR | Status: DC

## 2021-04-29 NOTE — Plan of Care (Signed)
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
## 2021-04-29 NOTE — Progress Notes (Signed)
Pt was given AVS discharge summary and went over with him and mom at bedside. IV was removed with catheter intact. Pt has no further questions.
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
## 2021-04-29 NOTE — Progress Notes (Signed)
Patient ID: Joshua Taylor, male   DOB: 2000/11/18, 20 y.o.   MRN: [id number]   ORTHOPAEDIC PROGRESS NOTE  s/p Procedure(s): OPEN REDUCTION INTERNAL FIXATION (ORIF) ELBOW/OLECRANON FRACTURE  SUBJECTIVE: Reports mild pain about operative site. No chest pain. No SOB. No nausea/vomiting. No other complaints.  OBJECTIVE: Drainage on splint   decrease sensory on small finger.  Decreased active extension of all fingers.  Concerned about ulnar nerve and radial nerve.    PE:  Vitals:   04/29/21 0817 04/29/21 1219  BP: 123/80 106/76  Pulse: 88 99  Resp: 17 16  Temp: 98.5 F (36.9 C) 98.1 F (36.7 C)  SpO2: 100% 100%     ASSESSMENT: Joshua Taylor is a 20 y.o. male doing well postoperatively.  PLAN: Weightbearing: NWB RUE Insicional and dressing care: Dressings left intact until follow-up Orthopedic device(s): None Showering: no VTE prophylaxis:  lovenox 30mg    decreased dose due to low weight   Pain control: well controlled Follow - up plan:  has appt Monday morning at 8:30 to see Joshua Taylor and Dr Thursday for wound and nerve check  Contact information:   Tarisha Fader A. Jena Gauss Physician Assistant [facility] [phone]  04/29/2021, 3:46 PM
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
## 2021-05-02 ENCOUNTER — Encounter (HOSPITAL_COMMUNITY): Payer: Self-pay | Admitting: Student

## 2021-05-29 ENCOUNTER — Other Ambulatory Visit: Payer: Self-pay

## 2021-05-29 ENCOUNTER — Ambulatory Visit: Payer: Medicaid Other | Attending: Student

## 2021-05-29 DIAGNOSIS — M25621 Stiffness of right elbow, not elsewhere classified: Secondary | ICD-10-CM | POA: Insufficient documentation

## 2021-05-29 DIAGNOSIS — M6281 Muscle weakness (generalized): Secondary | ICD-10-CM | POA: Diagnosis present

## 2021-05-29 NOTE — Patient Instructions (Signed)
Access Code: [email or web address] URL: [email or web address] Date: 05/29/2021 Prepared by: Sharol Roussel  Exercises Seated Forearm Pronation and Supination AROM - 2 x daily - 7 x weekly - 1 sets - 10 reps Seated Elbow Flexion and Extension AROM - 2 x daily - 7 x weekly - 1 sets - 10 reps Wrist AROM Flexion Extension - 2 x daily - 7 x weekly - 1 sets - 10 reps Seated Finger Composite Flexion Extension - 2 x daily - 7 x weekly - 1 sets - 10 reps Elbow Extension PROM - 1 x daily - 7 x weekly - 3 sets - 2 reps - 2-3 minutes hold Supported Elbow Flexion Extension PROM - 1 x daily - 7 x weekly - 1 sets - 10 reps - 5 hold Seated Scapular Retraction - 1 x daily - 7 x weekly - 1 sets - 10 reps - 5 hold  URL: [email or web address] Date: 05/29/2021 Prepared by: Sharol Roussel  Exercises Seated Forearm Pronation and Supination AROM - 2 x daily - 7 x weekly - 1 sets - 10 reps Seated Elbow Flexion and Extension AROM - 2 x daily - 7 x weekly - 1 sets - 10 reps Wrist AROM Flexion Extension - 2 x daily - 7 x weekly - 1 sets - 10 reps Seated Finger Composite Flexion Extension - 2 x daily - 7 x weekly - 1 sets - 10 reps Elbow Extension PROM - 1 x daily - 7 x weekly - 3 sets - 2 reps - 2-3 minutes hold Supported Elbow Flexion Extension PROM - 1 x daily - 7 x weekly - 1 sets - 10 reps - 5 hold Seated Scapular Retraction - 1 x daily - 7 x weekly - 1 sets - 10 reps - 5 hold
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
## 2021-06-05 ENCOUNTER — Ambulatory Visit: Payer: Medicaid Other

## 2021-06-05 ENCOUNTER — Other Ambulatory Visit: Payer: Self-pay

## 2021-06-05 DIAGNOSIS — M6281 Muscle weakness (generalized): Secondary | ICD-10-CM

## 2021-06-05 DIAGNOSIS — M25621 Stiffness of right elbow, not elsewhere classified: Secondary | ICD-10-CM
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
## 2021-06-08 ENCOUNTER — Telehealth: Payer: Self-pay

## 2021-06-08 ENCOUNTER — Ambulatory Visit: Payer: Medicaid Other

## 2021-06-08 NOTE — Telephone Encounter (Signed)
The patient did not show to his appointment.  He reports that he could not find the paper as he has something that he had to do.  He was provided with our phone number, so he can call next time and reminded of next appointment.  Sharol Roussel, PT, DPT, OCS, Crt. DN

## 2021-06-12 ENCOUNTER — Ambulatory Visit: Payer: Medicaid Other | Admitting: Physical Therapy

## 2021-06-13 ENCOUNTER — Telehealth: Payer: Self-pay | Admitting: Physical Therapy

## 2021-06-13 NOTE — Telephone Encounter (Signed)
Attempted to contact patient regarding no show to PT appointment. Left voicemail with next appointment reminder and also left a reminder of the clinic attendance policy.

## 2021-06-15 ENCOUNTER — Ambulatory Visit: Payer: Medicaid Other | Admitting: Physical Therapy

## 2021-06-16 ENCOUNTER — Telehealth: Payer: Self-pay | Admitting: Physical Therapy

## 2021-06-16 NOTE — Telephone Encounter (Signed)
Attempted to contact patient regarding no show to appointment  yesterday. Left voicemail with attendance policy and that he no longer has any PT appointments. Requested that he call to schedule one appointment at a time if he would like to continue PT.

## 2021-06-19 ENCOUNTER — Ambulatory Visit: Payer: Medicaid Other | Admitting: Physical Therapy

## 2021-06-22 ENCOUNTER — Ambulatory Visit: Payer: Medicaid Other | Admitting: Physical Therapy

## 2021-11-13 ENCOUNTER — Ambulatory Visit: Payer: Medicaid Other | Attending: Student
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
## 2023-05-17 ENCOUNTER — Other Ambulatory Visit: Payer: Self-pay

## 2023-05-17 ENCOUNTER — Emergency Department: Payer: 59

## 2023-05-17 DIAGNOSIS — Z7982 Long term (current) use of aspirin: Secondary | ICD-10-CM | POA: Diagnosis not present

## 2023-05-17 DIAGNOSIS — S20212A Contusion of left front wall of thorax, initial encounter: Secondary | ICD-10-CM | POA: Insufficient documentation

## 2023-05-17 DIAGNOSIS — W010XXA Fall on same level from slipping, tripping and stumbling without subsequent striking against object, initial encounter: Secondary | ICD-10-CM | POA: Insufficient documentation

## 2023-05-17 DIAGNOSIS — R0781 Pleurodynia: Secondary | ICD-10-CM | POA: Diagnosis present

## 2023-05-17 NOTE — ED Triage Notes (Signed)
Pt to ed from home via POV for a fall that happened over a week ago. Pt is now having symptoms. Pt has multiple complaints of pain in several places including rib cage, middle back area. Pt denies LOC at time of fall. Pt is caox4, in no acute distress and ambulatory in triage.

## 2023-05-18 ENCOUNTER — Emergency Department
Admission: EM | Admit: 2023-05-18 | Discharge: 2023-05-18 | Disposition: A | Discharge status: Home / Self Care | Payer: 59 | Attending: Emergency Medicine | Admitting: Emergency Medicine

## 2023-05-18 ENCOUNTER — Emergency Department: Payer: 59

## 2023-05-18 DIAGNOSIS — S20212A Contusion of left front wall of thorax, initial encounter: Secondary | ICD-10-CM | POA: Diagnosis not present

## 2023-05-18 DIAGNOSIS — W19XXXA Unspecified fall, initial encounter: Secondary | ICD-10-CM

## 2023-05-18 MED ORDER — HYDROCODONE-ACETAMINOPHEN 5-325 MG PO TABS
1.0000 | ORAL_TABLET | Freq: Once | ORAL | Status: AC
  Administered 2023-05-18: 1 via ORAL
  Filled 2023-05-18: qty 1

## 2023-05-18 MED ORDER — KETOROLAC TROMETHAMINE 60 MG/2ML IM SOLN
30.0000 mg | Freq: Once | INTRAMUSCULAR | Status: AC
  Administered 2023-05-18: 30 mg via INTRAMUSCULAR
  Filled 2023-05-18: qty 2

## 2023-05-18 MED ORDER — HYDROCODONE-ACETAMINOPHEN 5-325 MG PO TABS: 1.0000 | ORAL_TABLET | Freq: Four times a day (QID) | ORAL | 0 refills | Status: AC | PRN

## 2023-05-18 MED ORDER — NAPROXEN 500 MG PO TABS: 500.0000 mg | ORAL_TABLET | Freq: Two times a day (BID) | ORAL | 0 refills | Status: AC
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
## 2023-05-18 NOTE — Discharge Instructions (Signed)
You may take pain medicines as needed (Naprosyn/Norco).  Use incentive spirometer as instructed.  Return to the ER for worsening symptoms, persistent vomiting, difficulty breathing, fever or other concerns.

## 2023-05-18 NOTE — ED Notes (Addendum)
Pt. Educated on use of incentive spirometer, verbalized understanding and demonstrated correct use
